# Patient Record
Sex: Male | Born: 1995 | Race: White | Hispanic: Yes | Marital: Single
Health system: Southern US, Community
[De-identification: ages and names within clinical notes are randomized; demographics above are authoritative.]

## PROBLEM LIST (undated history)

## (undated) DIAGNOSIS — S83519A Sprain of anterior cruciate ligament of unspecified knee, initial encounter: Secondary | ICD-10-CM

## (undated) DIAGNOSIS — S83289A Other tear of lateral meniscus, current injury, unspecified knee, initial encounter: Secondary | ICD-10-CM

---
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
## 2017-08-11 IMAGING — DX DG KNEE COMPLETE 4+V*L*
4 series · 4 of 4 positions shown · non-contrast
Comparison: None.

CLINICAL DATA: Pain lt knee after being kicked today medial aspect
while playing soccer Pain all over Pt had trouble with medial
oblique due to pain

EXAM:
LEFT KNEE - COMPLETE 4+ VIEW

[t knee ap left]
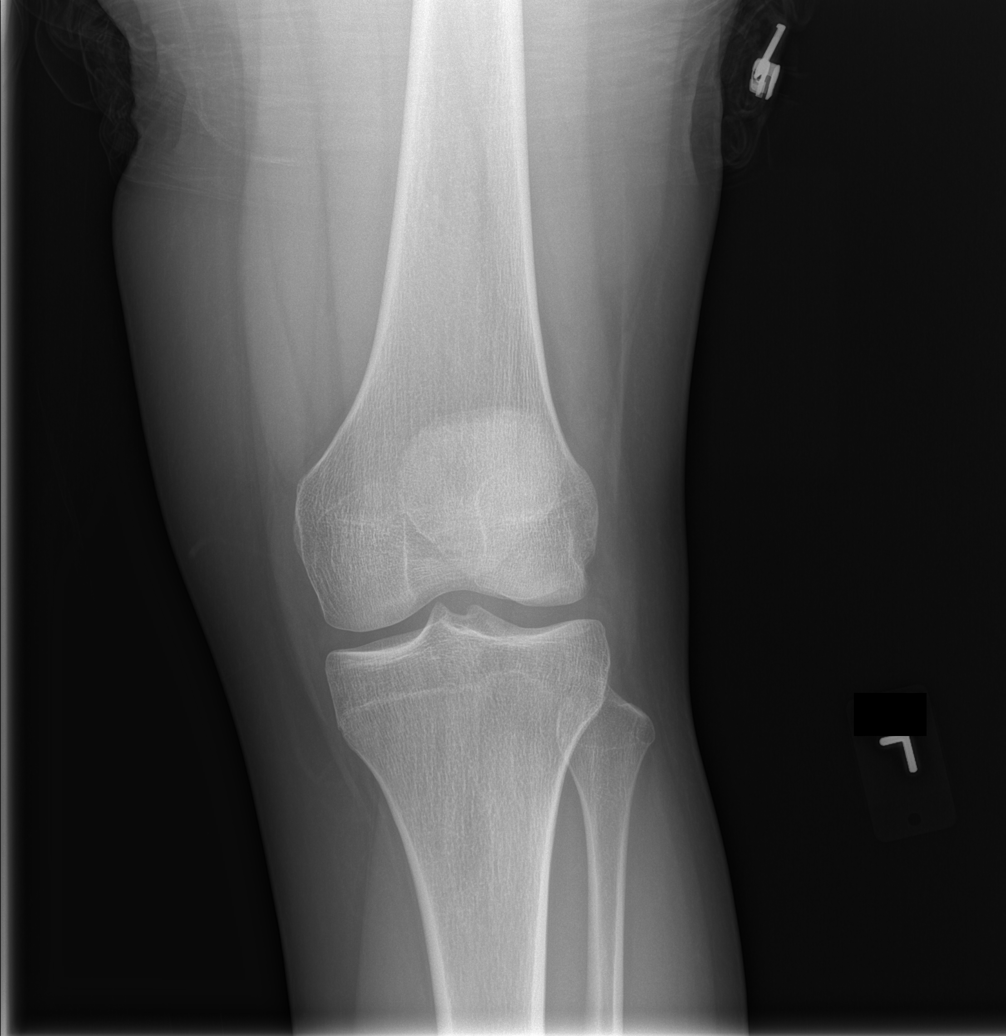

[t knee obl left]
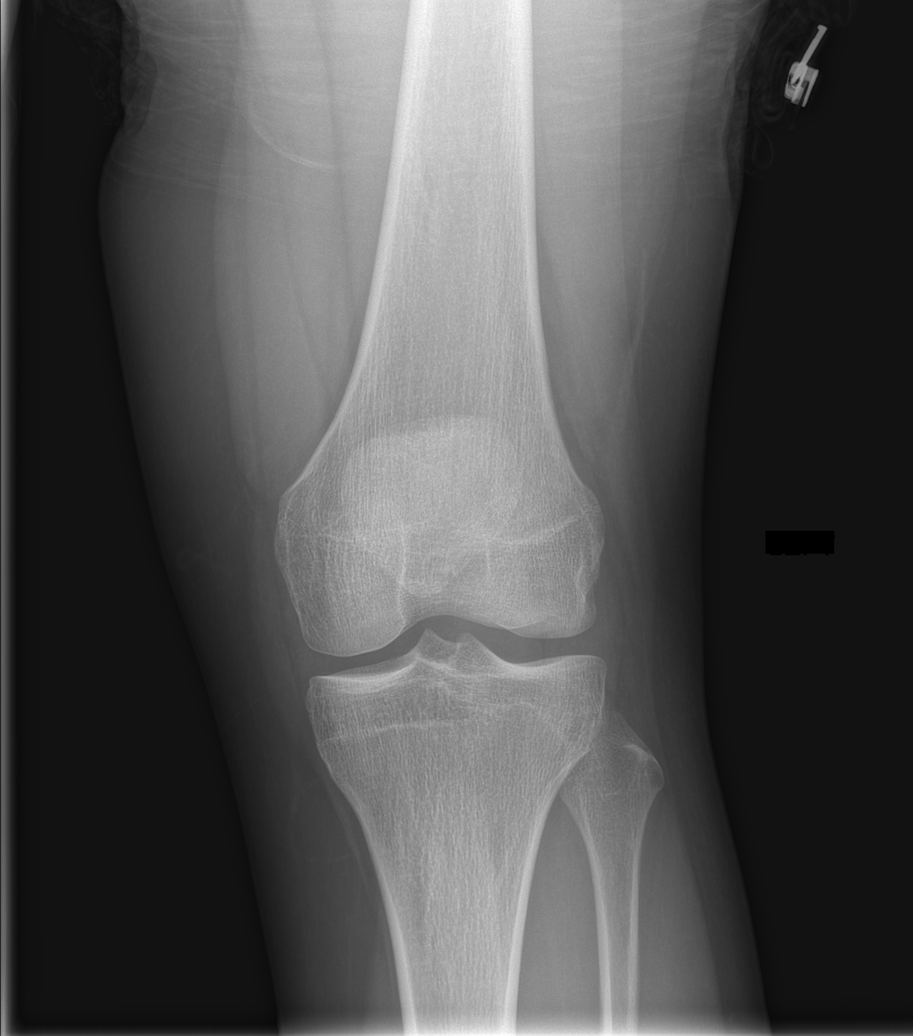

[t knee lat left]
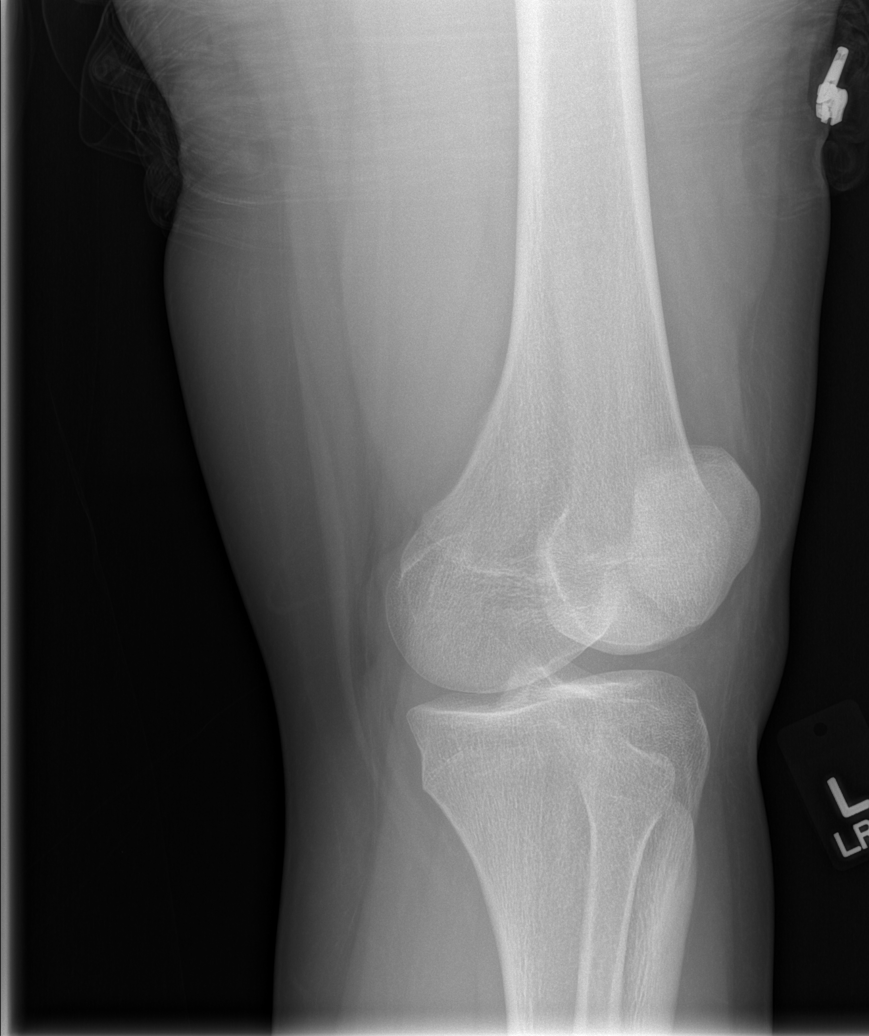

[t knee tunnel left]
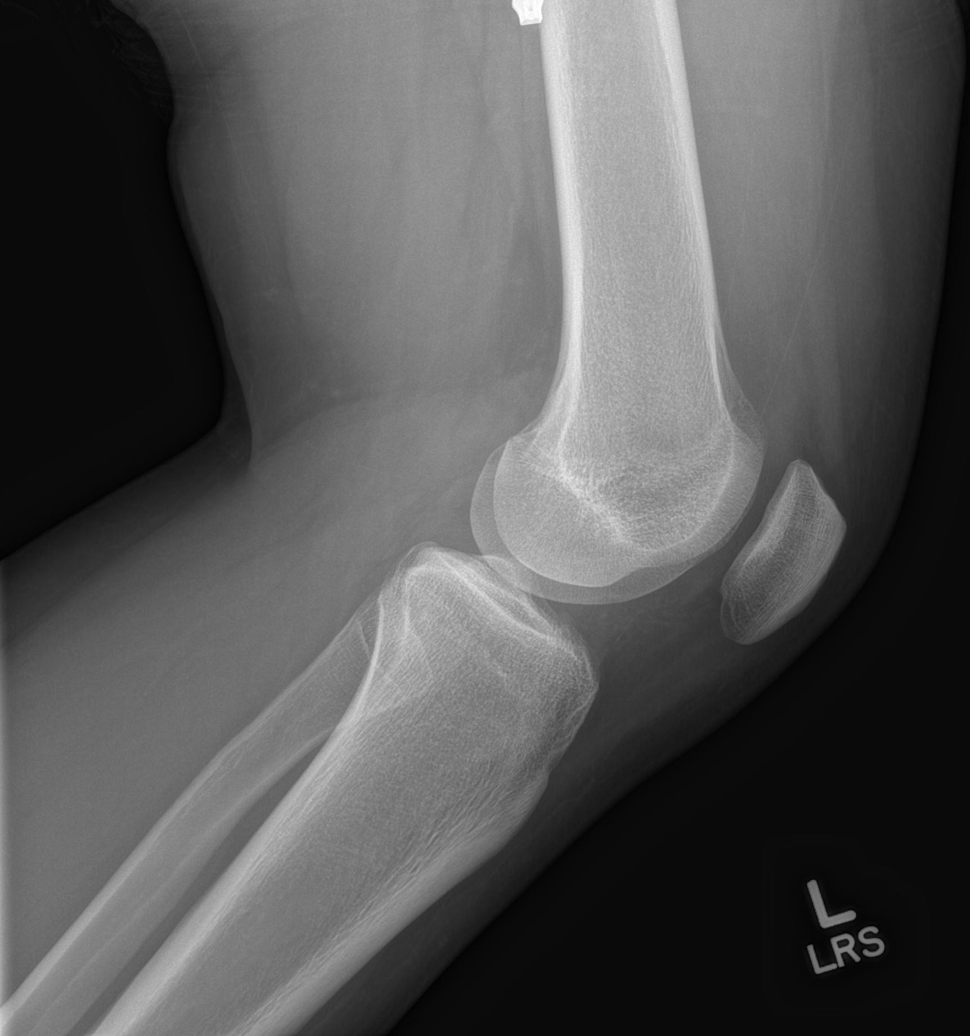

[4 of 4 positions shown; findings below may reference images not displayed]

FINDINGS: No evidence for acute fracture or dislocation. Trace joint effusion
is present.
IMPRESSION: Small joint effusion.

## 2017-10-16 ENCOUNTER — Encounter: Payer: Self-pay | Admitting: Pediatrics

## 2017-10-16 ENCOUNTER — Ambulatory Visit (INDEPENDENT_AMBULATORY_CARE_PROVIDER_SITE_OTHER): Payer: Medicaid Other | Admitting: Pediatrics

## 2017-10-16 VITALS — Temp 97.6°F | Wt 253.4 lb

## 2017-10-16 DIAGNOSIS — L600 Ingrowing nail: Secondary | ICD-10-CM | POA: Diagnosis not present

## 2017-10-16 DIAGNOSIS — Z113 Encounter for screening for infections with a predominantly sexual mode of transmission: Secondary | ICD-10-CM

## 2017-10-16 LAB — POCT RAPID HIV: Rapid HIV, POC: NEGATIVE

## 2017-10-16 MED ORDER — CLINDAMYCIN HCL 300 MG PO CAPS: 300.0000 mg | ORAL_CAPSULE | Freq: Three times a day (TID) | ORAL | 0 refills | Status: AC

## 2017-10-16 NOTE — Progress Notes (Signed)
   Subjective:     Tyler Blanchard, is a 22 y.o. male  HPI  Chief Complaint  Patient presents with  . foot concern    he thinks its a end grown toe nail.  On his right foot, he says it doesn't hurt all the time,  He does Holiday representativeconstruction work.  I hurts when he plays soccer   For 2-3 month Occasional pus come out of it Hurt with walking  Has not tried any treatment--except Q-tip and hydrogen peroxide on it directly  [facility]  He was going to ask for fasting labs, but he ate.   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  History and Problem List: Tyler Blanchard has Obesity; History of anaphylaxis; Acne; Mixed hyperlipidemia; and Vitamin D deficiency on their problem list.  Tyler Blanchard  has a past medical history of ACL tear (05/04/2015) and Lateral meniscus tear (05/04/2015).     Objective:     Vitals:   10/16/17 1357  Temp: 97.6 F (36.4 C)    Physical Exam   Right great toe: milk erythema and swelling ans slight tendernesss to palpation  No drainage with pressure,  Slight dried discharge Very short nail      Assessment & Plan:   1. Ingrown nail  With mild cellulitis Reviewed need to grow out nail, soaks, and using cotton underneath to help lift it out Hydrogen peroxide okay to use  - clindamycin (CLEOCIN) 300 MG capsule; Take 1 capsule (300 mg total) by mouth 3 (three) times daily.  Dispense: 21 capsule; Refill: 0  2. Screen for sexually transmitted diseases Routine for age - C. trachomatis/N. gonorrhoeae RNA - POCT Rapid HIV   Supportive care and return precautions reviewed.  Spent  15  minutes face to face time with patient; greater than 50% spent in counseling regarding diagnosis and  treatment plan.   Theadore NanHilary Ules Marsala, MD

## 2017-10-17 LAB — C. TRACHOMATIS/N. GONORRHOEAE RNA
C. TRACHOMATIS RNA, TMA: NOT DETECTED
N. GONORRHOEAE RNA, TMA: NOT DETECTED

## 2018-12-12 ENCOUNTER — Telehealth: Payer: Self-pay

## 2018-12-12 NOTE — Telephone Encounter (Signed)
LVM to call us back to schedule for PE. 

## 2023-12-29 ENCOUNTER — Emergency Department (HOSPITAL_COMMUNITY)
Admission: EM | Admit: 2023-12-29 | Discharge: 2023-12-29 | Disposition: A | Discharge status: Home / Self Care | Attending: Emergency Medicine | Admitting: Emergency Medicine

## 2023-12-29 ENCOUNTER — Encounter (HOSPITAL_COMMUNITY): Payer: Self-pay | Admitting: Emergency Medicine

## 2023-12-29 ENCOUNTER — Ambulatory Visit (HOSPITAL_COMMUNITY): Admission: EM | Admit: 2023-12-29 | Discharge: 2023-12-29 | Discharge status: Against Medical Advice | Payer: Self-pay

## 2023-12-29 ENCOUNTER — Emergency Department (HOSPITAL_COMMUNITY)

## 2023-12-29 DIAGNOSIS — W458XXA Other foreign body or object entering through skin, initial encounter: Secondary | ICD-10-CM | POA: Insufficient documentation

## 2023-12-29 DIAGNOSIS — Z23 Encounter for immunization: Secondary | ICD-10-CM | POA: Insufficient documentation

## 2023-12-29 DIAGNOSIS — S60459A Superficial foreign body of unspecified finger, initial encounter: Secondary | ICD-10-CM

## 2023-12-29 DIAGNOSIS — S60452A Superficial foreign body of right middle finger, initial encounter: Secondary | ICD-10-CM | POA: Diagnosis present

## 2023-12-29 MED ORDER — LIDOCAINE HCL 2 % IJ SOLN
15.0000 mL | Freq: Once | INTRAMUSCULAR | Status: AC
  Administered 2023-12-29: 300 mg
  Filled 2023-12-29: qty 20

## 2023-12-29 MED ORDER — CEPHALEXIN 500 MG PO CAPS: 500.0000 mg | ORAL_CAPSULE | Freq: Four times a day (QID) | ORAL | 0 refills | Status: AC

## 2023-12-29 MED ORDER — TETANUS-DIPHTH-ACELL PERTUSSIS 5-2.5-18.5 LF-MCG/0.5 IM SUSY
0.5000 mL | PREFILLED_SYRINGE | Freq: Once | INTRAMUSCULAR | Status: AC
  Administered 2023-12-29: 0.5 mL via INTRAMUSCULAR
  Filled 2023-12-29: qty 0.5

## 2023-12-29 NOTE — ED Provider Notes (Signed)
 [place] EMERGENCY DEPARTMENT AT [facility] Provider Note   CSN: [id number] Arrival date & time: 12/29/23  1058     Patient presents with: Nail Problem   Tyler Blanchard is a 28 y.o. male.   Patient reports that he accidentally shot a drywall staple into his right middle finger.  Patient complains of pain from the staple he has full range of motion of his finger he has normal sensation.  Patient denies any other area of injuries he is unsure the date of his last tetanus shot  The history is provided by the patient. No language interpreter was used.       Prior to Admission medications   Medication Sig Start Date End Date Taking? Authorizing Provider  cephALEXin  (KEFLEX ) 500 MG capsule Take 1 capsule (500 mg total) by mouth 4 (four) times daily. 12/29/23  Yes Legaci Tarman K, PA-C  cetirizine  (ZYRTEC ) 10 MG tablet Take one tablet by mouth once daily at bedtime for allergy symptom control Patient not taking: Reported on 10/16/2017 10/16/16   Stanley, Angela J, MD  Cholecalciferol (VITAMIN D ) 2000 units CAPS Take 2 capsules by mouth once a day for 30 days then decrease to one every day 10/26/16   Taft Jon PARAS, MD  clindamycin  (CLEOCIN ) 300 MG capsule Take 1 capsule (300 mg total) by mouth 3 (three) times daily. 10/16/17   Leta Crazier, MD  fluticasone  (FLONASE ) 50 MCG/ACT nasal spray Sniff one spray into each nostril once a day; gargle and spit after use Patient not taking: Reported on 10/16/2017 10/16/16   Taft Jon PARAS, MD  ibuprofen  (ADVIL ,MOTRIN ) 400 MG tablet Take one tablet by mouth every 6 to 8 hours as needed for pain relief Patient not taking: Reported on 10/16/2017 10/16/16   Taft Jon PARAS, MD    Allergies: Bee venom    Review of Systems  All other systems reviewed and are negative.   Updated Vital Signs BP 108/68   Pulse (!) 52   Temp 98.2 F (36.8 C)   Resp 17   SpO2 100%   Physical Exam Vitals and nursing note reviewed.  Constitutional:       Appearance: He is well-developed.  HENT:     Head: Normocephalic.  Cardiovascular:     Rate and Rhythm: Normal rate.  Pulmonary:     Effort: Pulmonary effort is normal.  Abdominal:     General: There is no distension.  Musculoskeletal:        General: Normal range of motion.     Comments: Large metal staple in distal right third finger, I can see the staple running under the nail, does not appear deep enough to have bony involvement.  Patient has full range of motion neurovascular neurosensory intact  Skin:    General: Skin is warm.  Neurological:     General: No focal deficit present.     Mental Status: He is alert and oriented to person, place, and time.     (all labs ordered are listed, but only abnormal results are displayed) Labs Reviewed - No data to display  EKG: None  Radiology: DG Finger Middle Right Result Date: 12/29/2023 CLINICAL DATA:  Staple in the distal right middle finger. EXAM: RIGHT MIDDLE FINGER 2+V COMPARISON:  None Available. FINDINGS: Large staple in the distal aspect of the right middle finger. Due to overlapping of the staple, it is difficult to determine if this is involving the 3rd distal tuft. No visible fracture. IMPRESSION: Large staple in the  distal aspect of the right middle finger. Due to overlapping of the staple, it is difficult to determine if this is involving the 3rd distal tuft. No visible fracture. Electronically Signed   By: Elspeth Bathe M.D.   On: 12/29/2023 11:36     .Foreign Body Removal  Date/Time: 12/29/2023 2:29 PM  Performed by: Flint Sonny POUR, PA-C Authorized by: Flint Sonny POUR, PA-C  Consent given by: patient Patient understanding: patient states understanding of the procedure being performed Imaging studies: imaging studies available Required items: required blood products, implants, devices, and special equipment available Patient identity confirmed: verbally with patient Time out: Immediately prior to procedure a  time out was called to verify the correct patient, procedure, equipment, support staff and site/side marked as required. Body area: skin General location: upper extremity Location details: right long finger  Anesthesia: Local Anesthetic: lidocaine  2% without epinephrine  Anesthetic total: 5 mL 1 objects recovered. Objects recovered: Stable Post-procedure assessment: foreign body removed Patient tolerance: patient tolerated the procedure well with no immediate complications     Medications Ordered in the ED  Tdap (BOOSTRIX ) injection 0.5 mL (0.5 mLs Intramuscular Given 12/29/23 1215)  lidocaine  (XYLOCAINE ) 2 % (with pres) injection 300 mg (300 mg Infiltration Given 12/29/23 1217)                                    Medical Decision Making Patient presents with a staple from a drywall stapler in his finger he is unsure of the date of his last tetanus shot  Amount and/or Complexity of Data Reviewed Independent Historian: spouse    Details: Patient is here with spouse who is supportive Radiology: ordered.    Details: X-ray shows staple and finger,  Risk Prescription drug management. Risk Details: Staple removed.  Patient soaked after procedure.  Patient is given a tetanus shot.  He is given a prescription for Keflex .  He is advised to watch for infection return to the emergency department if any problems.        Final diagnoses:  Foreign body in skin of finger, initial encounter    ED Discharge Orders          Ordered    cephALEXin  (KEFLEX ) 500 MG capsule  4 times daily        12/29/23 1245           An After Visit Summary was printed and given to the patient.     Flint Sonny POUR, PA-C 12/29/23 1431    Geraldene Hamilton, MD 12/30/23 1121

## 2023-12-29 NOTE — Discharge Instructions (Signed)
 Return if any problems.

## 2023-12-29 NOTE — ED Notes (Signed)
 PA at bedside to perform staple removal and suture.

## 2023-12-29 NOTE — ED Triage Notes (Signed)
 Per Dr. Vonna patient instructed to go to ED due to large staple through finger and finger nail to have removed.

## 2023-12-29 NOTE — ED Triage Notes (Addendum)
 Pt presents from [facility] for removal of construction staple from nail and tip of finger (R second finger). Unsure last tdap. Bleeding controlled pta.
